# Patient Record
Sex: Male | Born: 1985 | Race: Black or African American | Hispanic: No | Marital: Married | Smoking: Former smoker
Health system: Southern US, Community
[De-identification: ages and names within clinical notes are randomized; demographics above are authoritative.]

## PROBLEM LIST (undated history)

## (undated) DIAGNOSIS — F1910 Other psychoactive substance abuse, uncomplicated: Secondary | ICD-10-CM

## (undated) DIAGNOSIS — E119 Type 2 diabetes mellitus without complications: Secondary | ICD-10-CM

## (undated) HISTORY — DX: Type 2 diabetes mellitus without complications: E11.9

## (undated) HISTORY — DX: Other psychoactive substance abuse, uncomplicated: F19.10

---

## 2005-08-30 DIAGNOSIS — F1910 Other psychoactive substance abuse, uncomplicated: Secondary | ICD-10-CM

## 2005-08-30 HISTORY — DX: Other psychoactive substance abuse, uncomplicated: F19.10

## 2010-09-18 ENCOUNTER — Ambulatory Visit
Admission: RE | Admit: 2010-09-18 | Discharge: 2010-09-18 | Payer: Self-pay | Source: Home / Self Care | Attending: Family Medicine | Admitting: Family Medicine

## 2011-04-14 ENCOUNTER — Emergency Department (HOSPITAL_COMMUNITY)
Admission: EM | Admit: 2011-04-14 | Discharge: 2011-04-15 | Disposition: A | Discharge status: Home / Self Care | Payer: Managed Care, Other (non HMO) | Attending: Emergency Medicine | Admitting: Emergency Medicine

## 2011-04-14 DIAGNOSIS — X58XXXA Exposure to other specified factors, initial encounter: Secondary | ICD-10-CM | POA: Insufficient documentation

## 2011-04-14 DIAGNOSIS — T148XXA Other injury of unspecified body region, initial encounter: Secondary | ICD-10-CM | POA: Insufficient documentation

## 2011-04-14 DIAGNOSIS — R109 Unspecified abdominal pain: Secondary | ICD-10-CM | POA: Insufficient documentation

## 2011-04-15 ENCOUNTER — Emergency Department (HOSPITAL_COMMUNITY): Payer: Managed Care, Other (non HMO)

## 2011-04-15 ENCOUNTER — Encounter (HOSPITAL_COMMUNITY): Payer: Self-pay

## 2011-04-15 LAB — DIFFERENTIAL
Eosinophils Absolute: 0.2 10*3/uL (ref 0.0–0.7)
Eosinophils Relative: 3 % (ref 0–5)
Lymphs Abs: 2.5 10*3/uL (ref 0.7–4.0)
Monocytes Relative: 9 % (ref 3–12)
Neutrophils Relative %: 55 % (ref 43–77)

## 2011-04-15 LAB — URINALYSIS, ROUTINE W REFLEX MICROSCOPIC
Bilirubin Urine: NEGATIVE
Hgb urine dipstick: NEGATIVE
Ketones, ur: NEGATIVE mg/dL
Nitrite: NEGATIVE
pH: 6 (ref 5.0–8.0)

## 2011-04-15 LAB — AMYLASE: Amylase: 52 U/L (ref 0–105)

## 2011-04-15 LAB — COMPREHENSIVE METABOLIC PANEL
Albumin: 4.5 g/dL (ref 3.5–5.2)
BUN: 14 mg/dL (ref 6–23)
CO2: 26 mEq/L (ref 19–32)
Chloride: 102 mEq/L (ref 96–112)
Creatinine, Ser: 0.87 mg/dL (ref 0.50–1.35)
GFR calc Af Amer: >60 mL/min (ref >60)
GFR calc non Af Amer: >60 mL/min (ref >60)
Glucose, Bld: 117 mg/dL — ABNORMAL HIGH (ref 70–99)
Total Bilirubin: 0.6 mg/dL (ref 0.3–1.2)

## 2011-04-15 LAB — CBC
HCT: 39.2 % (ref 39.0–52.0)
MCH: 29.2 pg (ref 26.0–34.0)
MCV: 83.6 fL (ref 78.0–100.0)
Platelets: 228 10*3/uL (ref 150–400)
RBC: 4.69 MIL/uL (ref 4.22–5.81)

## 2011-04-15 LAB — LIPASE, BLOOD: Lipase: 38 U/L (ref 11–59)

## 2011-04-15 MED ORDER — IOHEXOL 300 MG/ML  SOLN
100.0000 mL | Freq: Once | INTRAMUSCULAR | Status: AC | PRN
  Administered 2011-04-15: 100 mL via INTRAVENOUS

## 2011-07-29 ENCOUNTER — Encounter: Payer: Self-pay | Admitting: Family Medicine

## 2011-11-12 ENCOUNTER — Encounter: Payer: Self-pay | Admitting: Family Medicine

## 2012-07-19 IMAGING — CT CT ABD-PELV W/ CM
2 of 4 series · 17 of 46 positions shown, 19 images · IV contrast (omnipaque)
Comparison: None.

CLINICAL DATA: Abdominal pain for 1 week.  WBC

CT ABDOMEN AND PELVIS WITH CONTRAST
TECHNIQUE: Multidetector CT imaging of the abdomen and pelvis was
performed following the standard protocol during bolus
administration of intravenous contrast.
Contrast: 100 ml Omnipaque 300

[Series 3: rtn ap with st · axial · 0.72mm/px · z∈[+960,+1366]mm · 14 of 89 slices shown, 16 images]
[im 4/89  soft-tissue]
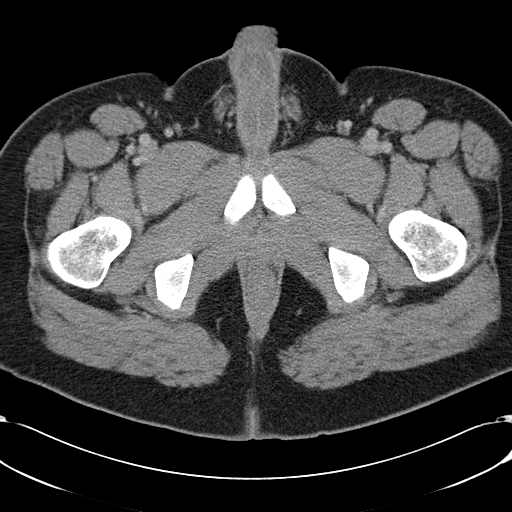
[im 4/89  bone]
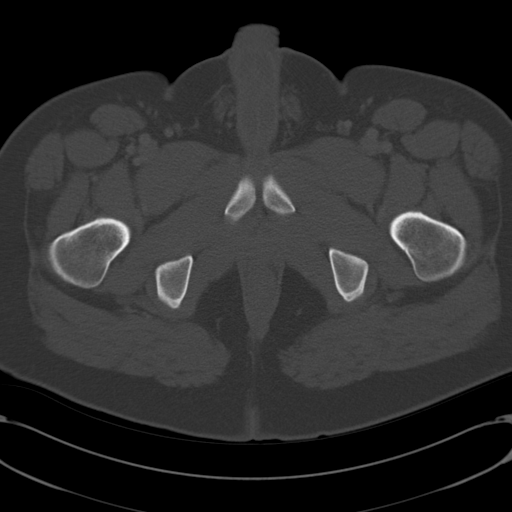
[im 11/89  soft-tissue]
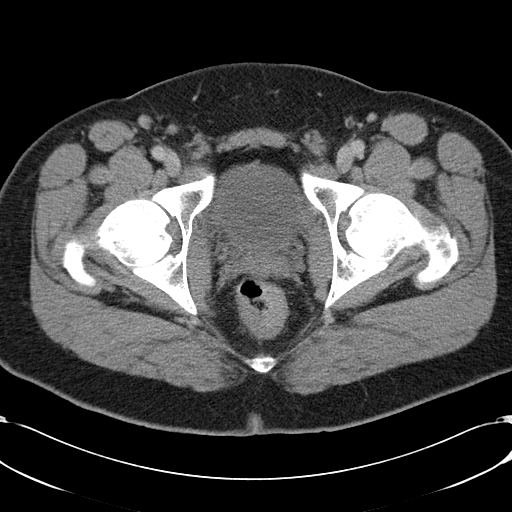
[im 18/89  soft-tissue]
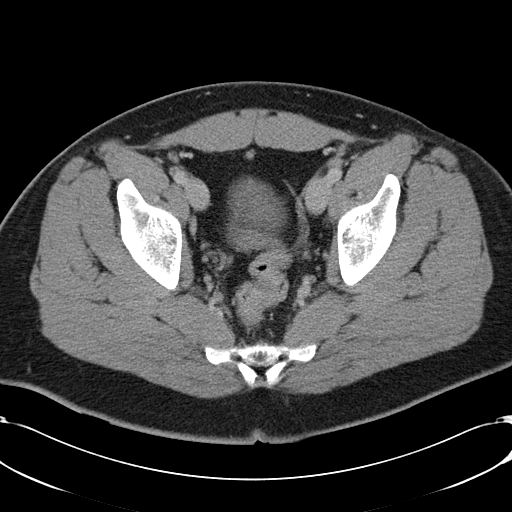
[im 25/89  soft-tissue]
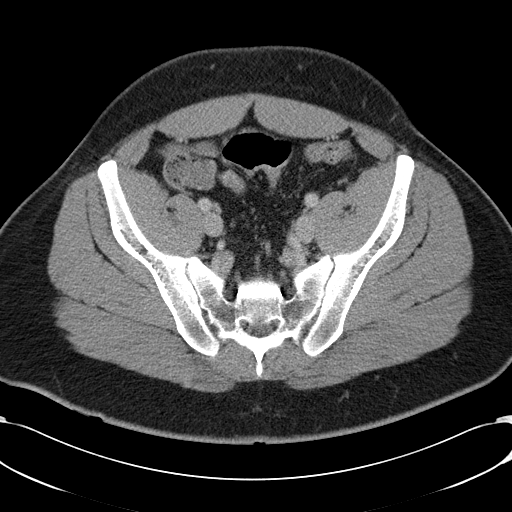
[im 29/89  soft-tissue]
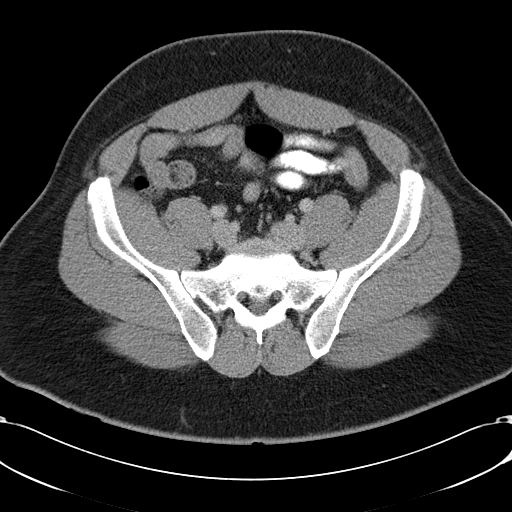
[im 36/89  soft-tissue]
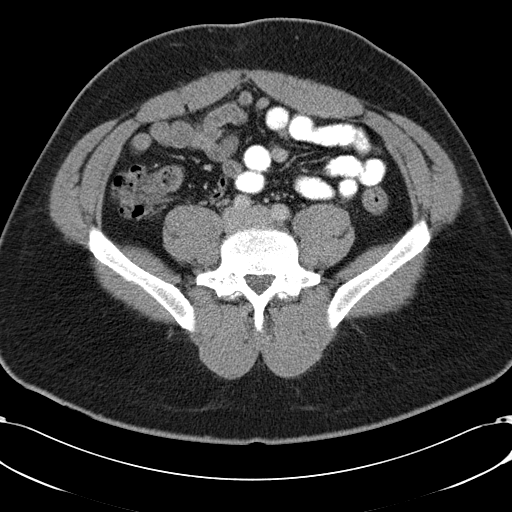
[im 43/89  soft-tissue]
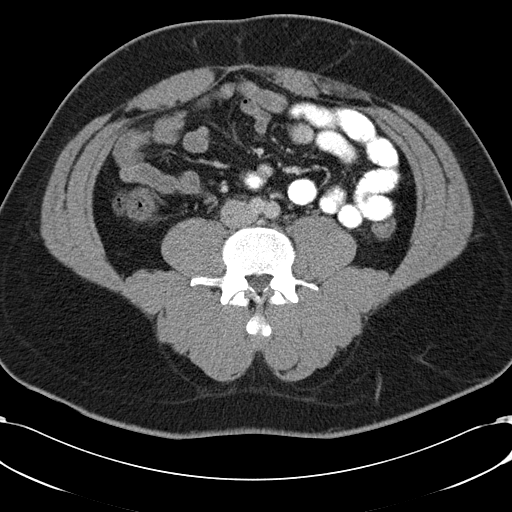
[im 46/89  soft-tissue]
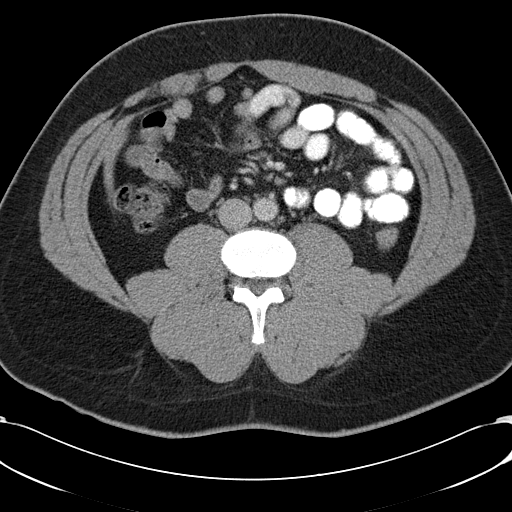
[im 53/89  soft-tissue]
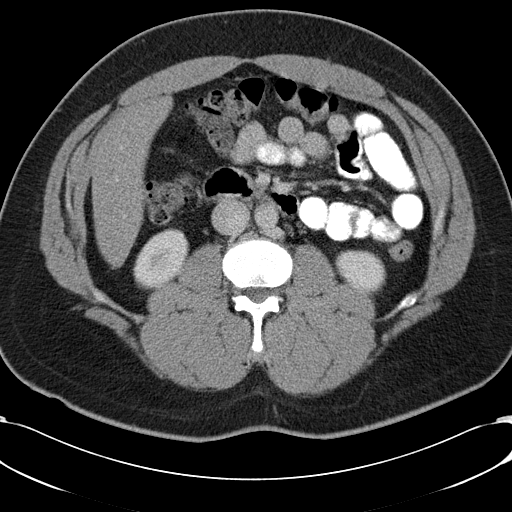
[im 53/89  bone]
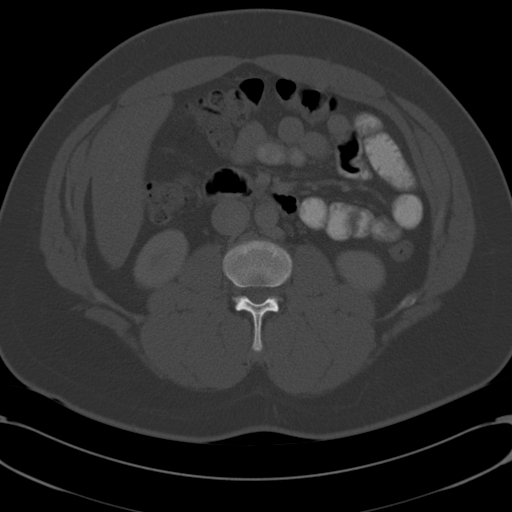
[im 60/89  soft-tissue]
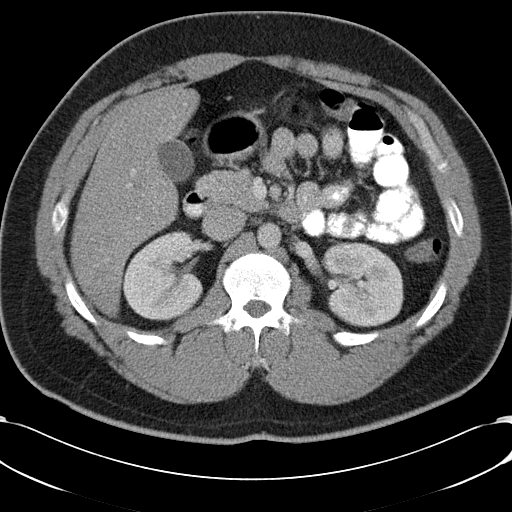
[im 67/89  soft-tissue]
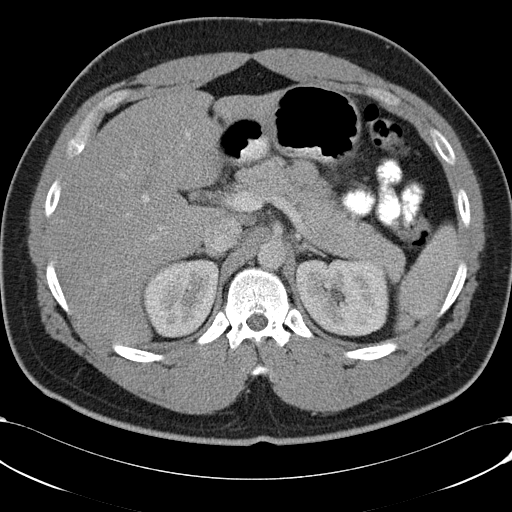
[im 71/89  soft-tissue]
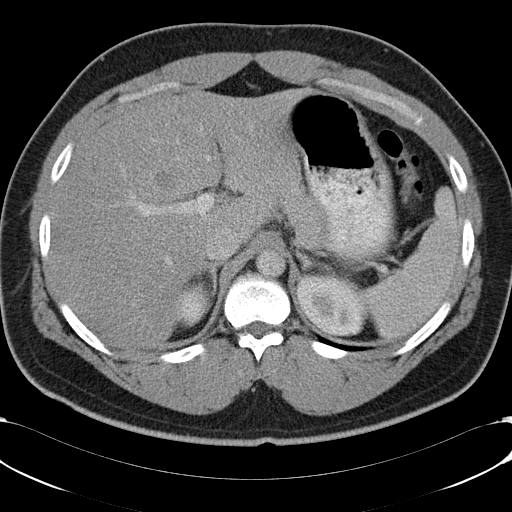
[im 78/89  soft-tissue]
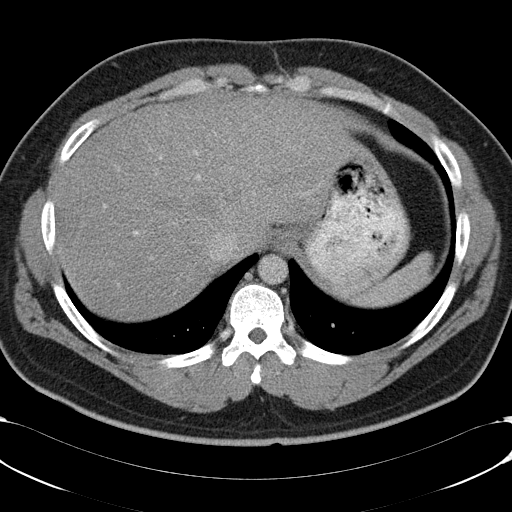
[im 85/89  soft-tissue]
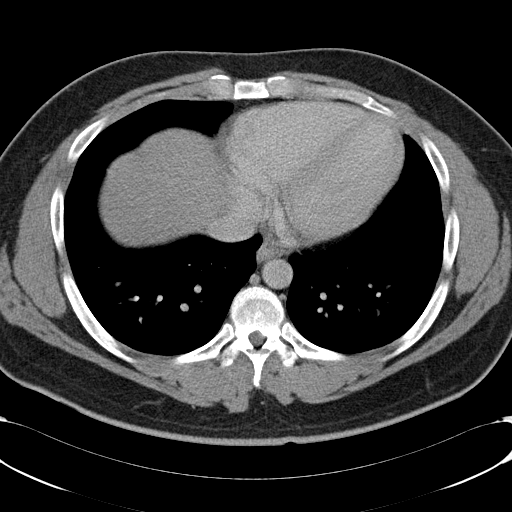

[Series 602: <mpr thick range> · coronal · 0.90mm/px · 3 of 81 slices shown]
[im 27/81  soft-tissue]
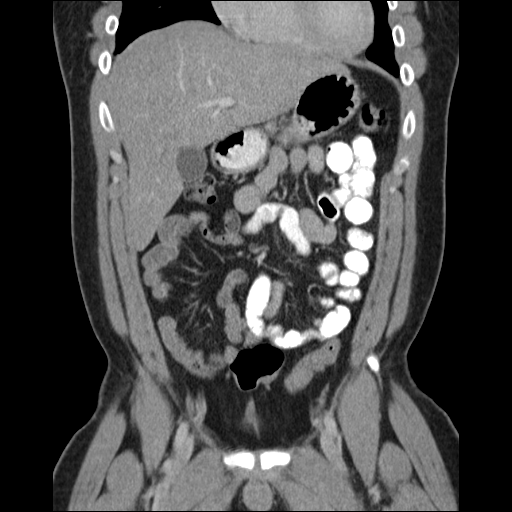
[im 36/81  soft-tissue]
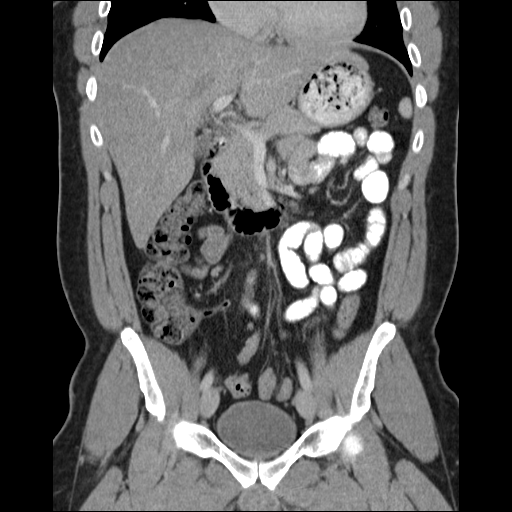
[im 45/81  soft-tissue]
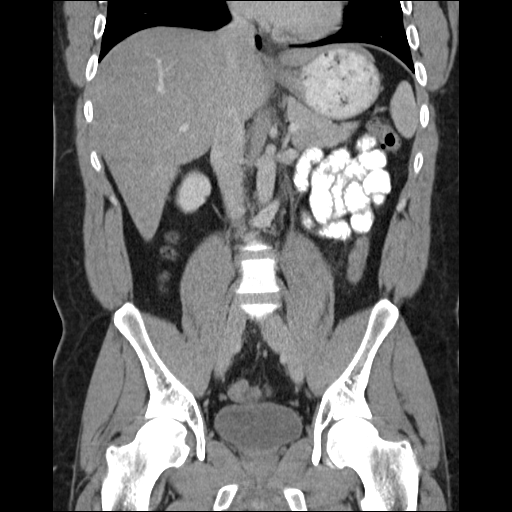

[17 of 46 positions shown; findings below may reference images not displayed]

FINDINGS: The lung bases are clear.  The liver, spleen,
gallbladder, bile ducts, pancreas, adrenal glands, kidneys,
abdominal aorta, retroperitoneal lymph nodes, stomach, and small
bowel are unremarkable.  Retroaortic left renal vein.  No free air
or free fluid in the abdomen.

Pelvis:  The appendix is normal.  No free or loculated pelvic fluid
collections.  The bladder wall is not thickened.  No inflammatory
changes demonstrated in the colon.
IMPRESSION: No acute abnormalities demonstrated in the abdomen or pelvis.

## 2012-08-15 ENCOUNTER — Encounter: Payer: Managed Care, Other (non HMO) | Attending: Internal Medicine | Admitting: *Deleted

## 2012-08-15 DIAGNOSIS — Z713 Dietary counseling and surveillance: Secondary | ICD-10-CM | POA: Insufficient documentation

## 2012-08-15 DIAGNOSIS — E119 Type 2 diabetes mellitus without complications: Secondary | ICD-10-CM | POA: Insufficient documentation

## 2012-08-17 ENCOUNTER — Encounter: Payer: Self-pay | Admitting: *Deleted

## 2012-08-17 NOTE — Progress Notes (Signed)
  Patient was seen on 08/15/2012 for the first of a series of three diabetes self-management courses at the [facility]. A1c on 07/12/2012 = 7.7%  The following learning objectives were met by the patient during this course:   Defines the role of glucose and insulin  Identifies type of diabetes and pathophysiology  Defines the diagnostic criteria for diabetes and prediabetes  States the risk factors for Type 2 Diabetes  States the symptoms of Type 2 Diabetes  Defines Type 2 Diabetes treatment goals  Defines Type 2 Diabetes treatment options  States the rationale for glucose monitoring  Identifies A1C, glucose targets, and testing times  Identifies proper sharps disposal  Defines the purpose of a diabetes food plan  Identifies carbohydrate food groups  Defines effects of carbohydrate foods on glucose levels  Identifies carbohydrate choices/grams/food labels  States benefits of physical activity and effect on glucose  Review of suggested activity guidelines  Handouts given during class include:  Type 2 Diabetes: Basics Book  My Food Plan Book  Food and Activity Log  Follow-Up Plan: Core Class 2

## 2012-08-17 NOTE — Patient Instructions (Signed)
Goals:  Follow Diabetes Meal Plan as instructed  Eat 3 meals and 2 snacks, every 3-5 hrs  Limit carbohydrate intake to 60-75 grams carbohydrate/meal  Limit carbohydrate intake to 0-30 grams carbohydrate/snack  Add lean protein foods to meals/snacks  Monitor glucose levels as instructed by your doctor  Aim for 30 mins of physical activity daily as tolerated  Bring food record and glucose log to your next nutrition visit

## 2012-09-19 ENCOUNTER — Encounter: Payer: Managed Care, Other (non HMO) | Attending: Internal Medicine

## 2012-09-19 DIAGNOSIS — E119 Type 2 diabetes mellitus without complications: Secondary | ICD-10-CM | POA: Insufficient documentation

## 2012-09-19 DIAGNOSIS — Z713 Dietary counseling and surveillance: Secondary | ICD-10-CM | POA: Insufficient documentation

## 2012-10-03 ENCOUNTER — Ambulatory Visit: Payer: Managed Care, Other (non HMO)

## 2013-12-06 ENCOUNTER — Emergency Department (INDEPENDENT_AMBULATORY_CARE_PROVIDER_SITE_OTHER)
Admission: EM | Admit: 2013-12-06 | Discharge: 2013-12-06 | Disposition: A | Discharge status: Home / Self Care | Payer: Managed Care, Other (non HMO) | Source: Home / Self Care | Attending: Family Medicine | Admitting: Family Medicine

## 2013-12-06 ENCOUNTER — Encounter (HOSPITAL_COMMUNITY): Payer: Self-pay | Admitting: Emergency Medicine

## 2013-12-06 DIAGNOSIS — S335XXA Sprain of ligaments of lumbar spine, initial encounter: Secondary | ICD-10-CM

## 2013-12-06 DIAGNOSIS — S39012A Strain of muscle, fascia and tendon of lower back, initial encounter: Secondary | ICD-10-CM

## 2013-12-06 DIAGNOSIS — X58XXXA Exposure to other specified factors, initial encounter: Secondary | ICD-10-CM

## 2013-12-06 MED ORDER — KETOROLAC TROMETHAMINE 30 MG/ML IJ SOLN
INTRAMUSCULAR | Status: AC
  Filled 2013-12-06: qty 1

## 2013-12-06 MED ORDER — KETOROLAC TROMETHAMINE 10 MG PO TABS: 10.0000 mg | ORAL_TABLET | Freq: Three times a day (TID) | ORAL | Status: AC | PRN

## 2013-12-06 MED ORDER — KETOROLAC TROMETHAMINE 30 MG/ML IJ SOLN
30.0000 mg | Freq: Once | INTRAMUSCULAR | Status: AC
  Administered 2013-12-06: 30 mg via INTRAMUSCULAR

## 2013-12-06 MED ORDER — CYCLOBENZAPRINE HCL 5 MG PO TABS: 5.0000 mg | ORAL_TABLET | Freq: Three times a day (TID) | ORAL | Status: AC | PRN

## 2013-12-06 NOTE — ED Notes (Signed)
Patient states here for back pain States was working in his yard bent over and must Have twisted himself the wrong way

## 2013-12-06 NOTE — ED Provider Notes (Signed)
CSN: [id number]     Arrival date & time 12/06/13  1646 History   First MD Initiated Contact with Patient 12/06/13 1845     Chief Complaint  Patient presents with  . Back Pain   (Consider location/radiation/quality/duration/timing/severity/associated sxs/prior Treatment) Patient is a 28 y.o. male presenting with back pain. The history is provided by the patient.  Back Pain Location:  Lumbar spine Quality:  Shooting and stiffness Pain severity:  Moderate Onset quality:  Gradual Duration:  1 day Progression:  Unchanged (onset after working in yard and bending, no neuro sx, no gi or gu sx.)   Past Medical History  Diagnosis Date  . Substance abuse 2007    TOBACCO(FORMER SMOKER)  . Diabetes mellitus without complication    History reviewed. No pertinent past surgical history. Family History  Problem Relation Age of Onset  . Diabetes Mother   . Cancer Maternal Aunt   . Arthritis Maternal Grandmother   . Hypertension Maternal Grandmother   . Heart disease Maternal Grandmother   . Diabetes Maternal Grandmother   . Cancer Maternal Grandmother   . COPD Maternal Grandmother   . Stroke Maternal Grandmother   . Arthritis Maternal Grandfather   . Cancer Maternal Grandfather   . Diabetes Maternal Grandfather   . Heart disease Maternal Grandfather   . Hypertension Maternal Grandfather   . COPD Maternal Grandfather   . Stroke Maternal Grandfather   . Arthritis Paternal Grandmother   . Cancer Paternal Grandmother   . Diabetes Paternal Grandmother   . Heart disease Paternal Grandmother   . Hypertension Paternal Grandmother   . COPD Paternal Grandmother   . Stroke Paternal Grandmother   . Hypertension Paternal Grandfather   . Heart disease Paternal Grandfather   . Diabetes Paternal Grandfather   . Cancer Paternal Grandfather   . Arthritis Paternal Grandfather   . COPD Paternal Grandfather   . Stroke Paternal Grandfather    History  Substance Use Topics  . Smoking status: Former  Smoker    Quit date: 07/12/2005  . Smokeless tobacco: Never Used  . Alcohol Use: Yes    Review of Systems  Constitutional: Negative.   Gastrointestinal: Negative.   Genitourinary: Negative.   Musculoskeletal: Positive for back pain. Negative for gait problem and myalgias.  Skin: Negative.     Allergies  Review of patient's allergies indicates no known allergies.  Home Medications   Current Outpatient Rx  Name  Route  Sig  Dispense  Refill  . cyclobenzaprine (FLEXERIL) 5 MG tablet   Oral   Take 1 tablet (5 mg total) by mouth 3 (three) times daily as needed for muscle spasms.   30 tablet   0   . ketorolac (TORADOL) 10 MG tablet   Oral   Take 1 tablet (10 mg total) by mouth every 8 (eight) hours as needed. For back pain   30 tablet   0   . Multiple Vitamins-Minerals (MENS MULTI VITAMIN & MINERAL) TABS   Oral   Take 1 tablet by mouth daily.            BP 137/84  Pulse 67  Temp(Src) 98.2 F (36.8 C) (Oral)  SpO2 99% Physical Exam  Nursing note and vitals reviewed. Constitutional: He is oriented to person, place, and time. He appears well-developed and well-nourished. No distress.  Abdominal: Soft. Bowel sounds are normal.  Musculoskeletal: He exhibits tenderness.       Lumbar back: He exhibits decreased range of motion, tenderness and spasm. He exhibits  no bony tenderness and normal pulse.       Back:  Neurological: He is alert and oriented to person, place, and time. He displays normal reflexes. He exhibits normal muscle tone.  Skin: Skin is warm and dry.    ED Course  Procedures (including critical care time) Labs Review Labs Reviewed - No data to display Imaging Review No results found.   MDM   1. Strain of lumbar spine       Linna Hoff, MD 12/06/13 [phone]

## 2022-05-23 ENCOUNTER — Encounter (HOSPITAL_COMMUNITY): Payer: Self-pay

## 2022-05-23 ENCOUNTER — Emergency Department (HOSPITAL_COMMUNITY): Payer: Managed Care, Other (non HMO)

## 2022-05-23 ENCOUNTER — Emergency Department (HOSPITAL_COMMUNITY)
Admission: EM | Admit: 2022-05-23 | Discharge: 2022-05-23 | Disposition: A | Discharge status: Home / Self Care | Payer: Self-pay | Attending: Emergency Medicine | Admitting: Emergency Medicine

## 2022-05-23 ENCOUNTER — Other Ambulatory Visit: Payer: Self-pay

## 2022-05-23 DIAGNOSIS — R1032 Left lower quadrant pain: Secondary | ICD-10-CM | POA: Insufficient documentation

## 2022-05-23 DIAGNOSIS — N1330 Unspecified hydronephrosis: Secondary | ICD-10-CM | POA: Insufficient documentation

## 2022-05-23 LAB — COMPREHENSIVE METABOLIC PANEL
ALT: 23 U/L (ref 0–44)
AST: 23 U/L (ref 15–41)
Albumin: 4.3 g/dL (ref 3.5–5.0)
Alkaline Phosphatase: 61 U/L (ref 38–126)
Anion gap: 13 (ref 5–15)
BUN: 13 mg/dL (ref 6–20)
CO2: 23 mmol/L (ref 22–32)
Calcium: 9.4 mg/dL (ref 8.9–10.3)
Chloride: 102 mmol/L (ref 98–111)
Creatinine, Ser: 1.34 mg/dL — ABNORMAL HIGH (ref 0.61–1.24)
GFR, Estimated: >60 mL/min (ref >60)
Glucose, Bld: 267 mg/dL — ABNORMAL HIGH (ref 70–99)
Potassium: 3.5 mmol/L (ref 3.5–5.1)
Sodium: 138 mmol/L (ref 135–145)
Total Bilirubin: 0.8 mg/dL (ref 0.3–1.2)
Total Protein: 6.8 g/dL (ref 6.5–8.1)

## 2022-05-23 LAB — URINALYSIS, ROUTINE W REFLEX MICROSCOPIC
Bacteria, UA: NONE SEEN
Bilirubin Urine: NEGATIVE
Glucose, UA: >=500 mg/dL — AB
Hgb urine dipstick: NEGATIVE
Ketones, ur: 5 mg/dL — AB
Leukocytes,Ua: NEGATIVE
Nitrite: NEGATIVE
Protein, ur: NEGATIVE mg/dL
Specific Gravity, Urine: 1.018 (ref 1.005–1.030)
pH: 6 (ref 5.0–8.0)

## 2022-05-23 LAB — CBC WITH DIFFERENTIAL/PLATELET
Abs Immature Granulocytes: 0.02 10*3/uL (ref 0.00–0.07)
Basophils Absolute: 0 10*3/uL (ref 0.0–0.1)
Basophils Relative: 1 %
Eosinophils Absolute: 0.2 10*3/uL (ref 0.0–0.5)
Eosinophils Relative: 2 %
HCT: 37.6 % — ABNORMAL LOW (ref 39.0–52.0)
Hemoglobin: 13.6 g/dL (ref 13.0–17.0)
Immature Granulocytes: 0 %
Lymphocytes Relative: 60 %
Lymphs Abs: 4.4 10*3/uL — ABNORMAL HIGH (ref 0.7–4.0)
MCH: 29.8 pg (ref 26.0–34.0)
MCHC: 36.2 g/dL — ABNORMAL HIGH (ref 30.0–36.0)
MCV: 82.5 fL (ref 80.0–100.0)
Monocytes Absolute: 0.5 10*3/uL (ref 0.1–1.0)
Monocytes Relative: 7 %
Neutro Abs: 2.2 10*3/uL (ref 1.7–7.7)
Neutrophils Relative %: 30 %
Platelets: 270 10*3/uL (ref 150–400)
RBC: 4.56 MIL/uL (ref 4.22–5.81)
RDW: 11.9 % (ref 11.5–15.5)
WBC: 7.2 10*3/uL (ref 4.0–10.5)
nRBC: 0 % (ref 0.0–0.2)

## 2022-05-23 LAB — LIPASE, BLOOD: Lipase: 58 U/L — ABNORMAL HIGH (ref 11–51)

## 2022-05-23 MED ORDER — ONDANSETRON 4 MG PO TBDP: 4.0000 mg | ORAL_TABLET | Freq: Three times a day (TID) | ORAL | 0 refills | Status: AC | PRN

## 2022-05-23 MED ORDER — METOCLOPRAMIDE HCL 5 MG/ML IJ SOLN: 10.0000 mg | Freq: Once | INTRAMUSCULAR | Status: DC

## 2022-05-23 MED ORDER — HYDROMORPHONE HCL 1 MG/ML IJ SOLN
1.0000 mg | Freq: Once | INTRAMUSCULAR | Status: AC
  Administered 2022-05-23: 1 mg via INTRAVENOUS
  Filled 2022-05-23: qty 1

## 2022-05-23 MED ORDER — SODIUM CHLORIDE 0.9 % IV BOLUS
1000.0000 mL | Freq: Once | INTRAVENOUS | Status: AC
  Administered 2022-05-23: 1000 mL via INTRAVENOUS

## 2022-05-23 MED ORDER — ONDANSETRON HCL 4 MG/2ML IJ SOLN
4.0000 mg | Freq: Once | INTRAMUSCULAR | Status: AC
  Administered 2022-05-23: 4 mg via INTRAVENOUS
  Filled 2022-05-23: qty 2

## 2022-05-23 MED ORDER — KETOROLAC TROMETHAMINE 30 MG/ML IJ SOLN
30.0000 mg | Freq: Once | INTRAMUSCULAR | Status: AC
  Administered 2022-05-23: 30 mg via INTRAVENOUS
  Filled 2022-05-23: qty 1

## 2022-05-23 MED ORDER — OXYCODONE-ACETAMINOPHEN 5-325 MG PO TABS: 1.0000 | ORAL_TABLET | Freq: Four times a day (QID) | ORAL | 0 refills | Status: AC | PRN

## 2022-05-23 MED ORDER — OXYCODONE-ACETAMINOPHEN 5-325 MG PO TABS
1.0000 | ORAL_TABLET | Freq: Once | ORAL | Status: AC
  Administered 2022-05-23: 1 via ORAL
  Filled 2022-05-23: qty 1

## 2022-05-23 MED ORDER — CEPHALEXIN 500 MG PO CAPS: 500.0000 mg | ORAL_CAPSULE | Freq: Two times a day (BID) | ORAL | 0 refills | Status: AC

## 2022-05-23 NOTE — ED Triage Notes (Signed)
Patient arrives POV c/o left sided abdominal pain that started 1.5 hours ago and has progressively worsened. Pt states he initially thought "he was bloated." Pt reports pain is 10/10, constant, tight, and sharp.

## 2022-05-23 NOTE — Discharge Instructions (Signed)
You pain is thought to have been from a kidney stone.  Take pain medication as prescribed.  Return if your symptoms change or worsen.  Follow-up with the urologist listed.

## 2022-05-23 NOTE — ED Notes (Signed)
Patient transported to CT 

## 2022-05-23 NOTE — ED Provider Notes (Signed)
[facility]EMERGENCY DEPT [facility] Emergency Department Provider Note MRN:  [id number]  Arrival date & time: 05/23/22     Chief Complaint   Abdominal Pain   History of Present Illness   Austin Fernandez is a 36 y.o. year-old male presents to the ED with chief complaint of sudden onset left flank pain.  Awakened him from sleep.  Reports associated nausea.  Denies hx of kidney stones.  Denies dysuria or hematuria.  Denies fevers or chills.  History provided by patient.   Review of Systems  Pertinent positive and negative review of systems noted in HPI.    Physical Exam   Vitals:   05/23/22 0415 05/23/22 0500  BP: (!) 155/88 133/89  Pulse: 99 87  Resp: 18 16  Temp:  (!) 96.6 F (35.9 C)  SpO2: 100% 100%    CONSTITUTIONAL:  uncomfortable-appearing, NAD NEURO:  Alert and oriented x 3, CN 3-12 grossly intact EYES:  eyes equal and reactive ENT/NECK:  Supple, no stridor  CARDIO:  normal rate, regular rhythm, appears well-perfused  PULM:  No respiratory distress, CTAB GI/GU:  non-distended,  MSK/SPINE:  No gross deformities, no edema, moves all extremities  SKIN:  no rash, atraumatic   *Additional and/or pertinent findings included in MDM below  Diagnostic and Interventional Summary    EKG Interpretation  Date/Time:    Ventricular Rate:    PR Interval:    QRS Duration:   QT Interval:    QTC Calculation:   R Axis:     Text Interpretation:         Labs Reviewed  COMPREHENSIVE METABOLIC PANEL - Abnormal; Notable for the following components:      Result Value   Glucose, Bld 267 (*)    Creatinine, Ser 1.34 (*)    All other components within normal limits  LIPASE, BLOOD - Abnormal; Notable for the following components:   Lipase 58 (*)    All other components within normal limits  CBC WITH DIFFERENTIAL/PLATELET - Abnormal; Notable for the following components:   HCT 37.6 (*)    MCHC 36.2 (*)    Lymphs Abs 4.4 (*)    All other components within normal limits   URINALYSIS, ROUTINE W REFLEX MICROSCOPIC - Abnormal; Notable for the following components:   Glucose, UA >=500 (*)    Ketones, ur 5 (*)    All other components within normal limits    CT Renal Stone Study  Final Result      Medications  HYDROmorphone (DILAUDID) injection 1 mg (1 mg Intravenous Given 05/23/22 0322)  ondansetron (ZOFRAN) injection 4 mg (4 mg Intravenous Given 05/23/22 0321)  sodium chloride 0.9 % bolus 1,000 mL (0 mLs Intravenous Stopped 05/23/22 0509)  ketorolac (TORADOL) 30 MG/ML injection 30 mg (30 mg Intravenous Given 05/23/22 0352)  oxyCODONE-acetaminophen (PERCOCET/ROXICET) 5-325 MG per tablet 1 tablet (1 tablet Oral Given 05/23/22 0442)     Procedures  /  Critical Care Procedures  ED Course and Medical Decision Making  I have reviewed the triage vital signs, the nursing notes, and pertinent available records from the EMR.  Social Determinants Affecting Complexity of Care: Patient has no clinically significant social determinants affecting this chief complaint..   ED Course:    Medical Decision Making Patient here with sudden onset left flank and lower abdominal pain.  Pain awakened him from sleep.  When I initially saw the patient in triage she was diaphoretic and doubled over with pain.  He was given 1 mg of Dilaudid and  Zofran.  On reassessment, he is resting comfortably on the stretcher, but states that he still has some pain.  CT scan discussed below, but no visible kidney stone, but there is hydro ureteronephrosis and perinephric stranding and edema which could suggest radiolucent stone, recently passed stone, or a sending UTI.  Given sudden onset of symptoms, I suspect the stone is more likely.  Mildly increased creatinine of 1.34.  5:23 AM Patient feeling significantly improved.  He states that he is ready to be discharged.  We will send him home with a few Percocet, Zofran, Keflex.  Recommend urology follow-up.  Amount and/or Complexity of Data  Reviewed Labs: ordered.    Details: Creatinine 1.34, glucose 267, no leukocytosis, Radiology: ordered and independent interpretation performed.    Details: No obvious stone Radiology comments on mild left hydroureteronephrosis and perinephric edema  Risk Prescription drug management. Decision regarding hospitalization.     Consultants: No consultations were needed in caring for this patient.   Treatment and Plan: I considered admission due to patient's initial presentation, but after considering the examination and diagnostic results, patient will not require admission and can be discharged with outpatient follow-up.    Final Clinical Impressions(s) / ED Diagnoses     ICD-10-CM   1. Hydroureteronephrosis  N13.30     2. Left lower quadrant abdominal pain  R10.32       ED Discharge Orders          Ordered    oxyCODONE-acetaminophen (PERCOCET/ROXICET) 5-325 MG tablet  Every 6 hours PRN        05/23/22 0516    ondansetron (ZOFRAN-ODT) 4 MG disintegrating tablet  Every 8 hours PRN        05/23/22 0516    cephALEXin (KEFLEX) 500 MG capsule  2 times daily        05/23/22 0516              Discharge Instructions Discussed with and Provided to Patient:     Discharge Instructions      You pain is thought to have been from a kidney stone.  Take pain medication as prescribed.  Return if your symptoms change or worsen.  Follow-up with the urologist listed.       Montine Circle, PA-C 05/23/22 1700    Maudie Flakes, MD 05/23/22 [phone]
